# Patient Record
Sex: Male | Born: 1935 | Race: White | Hispanic: No | Marital: Married
Health system: Southern US, Community
[De-identification: ages and names within clinical notes are randomized; demographics above are authoritative.]

---

## 2004-07-12 ENCOUNTER — Ambulatory Visit: Payer: Self-pay | Admitting: General Surgery

## 2004-07-12 ENCOUNTER — Other Ambulatory Visit: Payer: Self-pay

## 2004-07-18 ENCOUNTER — Ambulatory Visit: Payer: Self-pay | Admitting: General Surgery

## 2004-09-05 ENCOUNTER — Ambulatory Visit: Payer: Self-pay | Admitting: General Surgery

## 2005-02-11 ENCOUNTER — Other Ambulatory Visit: Payer: Self-pay

## 2005-02-11 ENCOUNTER — Observation Stay: Payer: Self-pay | Admitting: Internal Medicine

## 2008-11-12 ENCOUNTER — Ambulatory Visit: Payer: Self-pay | Admitting: Family Medicine

## 2009-06-15 ENCOUNTER — Inpatient Hospital Stay: Payer: Self-pay | Admitting: Internal Medicine

## 2009-06-17 ENCOUNTER — Emergency Department: Payer: Self-pay | Admitting: Internal Medicine

## 2009-06-25 ENCOUNTER — Ambulatory Visit: Payer: Self-pay | Admitting: Vascular Surgery

## 2009-07-07 ENCOUNTER — Inpatient Hospital Stay: Payer: Self-pay | Admitting: Vascular Surgery

## 2009-10-25 ENCOUNTER — Encounter: Payer: Self-pay | Admitting: Family Medicine

## 2009-11-05 ENCOUNTER — Encounter: Payer: Self-pay | Admitting: Family Medicine

## 2011-05-22 ENCOUNTER — Ambulatory Visit: Payer: Self-pay | Admitting: Family Medicine

## 2011-07-02 ENCOUNTER — Inpatient Hospital Stay: Payer: Self-pay | Admitting: Internal Medicine

## 2011-07-02 LAB — COMPREHENSIVE METABOLIC PANEL
Albumin: 3 g/dL — ABNORMAL LOW (ref 3.4–5.0)
Alkaline Phosphatase: 53 U/L (ref 50–136)
Bilirubin,Total: 0.6 mg/dL (ref 0.2–1.0)
Co2: 23 mmol/L (ref 21–32)
Creatinine: 1.96 mg/dL — ABNORMAL HIGH (ref 0.60–1.30)
EGFR (Non-African Amer.): 35 — ABNORMAL LOW
Glucose: 189 mg/dL — ABNORMAL HIGH (ref 65–99)
Osmolality: 306 (ref 275–301)
SGOT(AST): 20 U/L (ref 15–37)
SGPT (ALT): 31 U/L
Sodium: 145 mmol/L (ref 136–145)

## 2011-07-02 LAB — DRUG SCREEN, URINE
Amphetamines, Ur Screen: NEGATIVE (ref ?–1000)
Benzodiazepine, Ur Scrn: NEGATIVE (ref ?–200)
Cocaine Metabolite,Ur ~~LOC~~: NEGATIVE (ref ?–300)
MDMA (Ecstasy)Ur Screen: NEGATIVE (ref ?–500)
Methadone, Ur Screen: NEGATIVE (ref ?–300)
Opiate, Ur Screen: NEGATIVE (ref ?–300)
Tricyclic, Ur Screen: NEGATIVE (ref ?–1000)

## 2011-07-02 LAB — ETHANOL
Ethanol %: <0.003 % (ref 0.000–0.080)
Ethanol: <3 mg/dL

## 2011-07-02 LAB — URINALYSIS, COMPLETE
Bacteria: NONE SEEN
Blood: NEGATIVE
Hyaline Cast: 3
Ketone: NEGATIVE
Leukocyte Esterase: NEGATIVE
Nitrite: NEGATIVE
Protein: NEGATIVE
Specific Gravity: 1.02 (ref 1.003–1.030)

## 2011-07-02 LAB — CK TOTAL AND CKMB (NOT AT ARMC)
CK, Total: 53 U/L (ref 35–232)
CK-MB: 2.3 ng/mL (ref 0.5–3.6)

## 2011-07-02 LAB — TROPONIN I: Troponin-I: 0.02 ng/mL

## 2011-07-02 LAB — CBC
HCT: 43.8 % (ref 40.0–52.0)
HGB: 14.9 g/dL (ref 13.0–18.0)
MCH: 30.7 pg (ref 26.0–34.0)
MCHC: 34.1 g/dL (ref 32.0–36.0)
MCV: 90 fL (ref 80–100)
RBC: 4.86 10*6/uL (ref 4.40–5.90)

## 2011-07-02 LAB — PROTIME-INR: Prothrombin Time: 13.6 secs (ref 11.5–14.7)

## 2011-07-03 LAB — CBC WITH DIFFERENTIAL/PLATELET
Basophil #: 0 10*3/uL (ref 0.0–0.1)
Basophil %: 0.1 %
Eosinophil #: 0 10*3/uL (ref 0.0–0.7)
Eosinophil %: 0.4 %
HGB: 14.3 g/dL (ref 13.0–18.0)
Lymphocyte #: 2.3 10*3/uL (ref 1.0–3.6)
MCH: 30 pg (ref 26.0–34.0)
MCV: 91 fL (ref 80–100)
Monocyte #: 0.8 10*3/uL — ABNORMAL HIGH (ref 0.0–0.7)
Monocyte %: 5.9 %
Neutrophil #: 9.6 10*3/uL — ABNORMAL HIGH (ref 1.4–6.5)
Neutrophil %: 75.7 %
Platelet: 119 10*3/uL — ABNORMAL LOW (ref 150–440)
RBC: 4.78 10*6/uL (ref 4.40–5.90)
WBC: 12.7 10*3/uL — ABNORMAL HIGH (ref 3.8–10.6)

## 2011-07-03 LAB — LIPID PANEL
HDL Cholesterol: 27 mg/dL — ABNORMAL LOW (ref 40–60)
Ldl Cholesterol, Calc: 65 mg/dL (ref 0–100)
Triglycerides: 252 mg/dL — ABNORMAL HIGH (ref 0–200)
VLDL Cholesterol, Calc: 50 mg/dL — ABNORMAL HIGH (ref 5–40)

## 2011-07-03 LAB — HEMOGLOBIN A1C: Hemoglobin A1C: 7.2 % — ABNORMAL HIGH (ref 4.2–6.3)

## 2011-07-03 LAB — BASIC METABOLIC PANEL
BUN: 42 mg/dL — ABNORMAL HIGH (ref 7–18)
Calcium, Total: 8.3 mg/dL — ABNORMAL LOW (ref 8.5–10.1)
Co2: 25 mmol/L (ref 21–32)
Creatinine: 1.89 mg/dL — ABNORMAL HIGH (ref 0.60–1.30)
EGFR (African American): 45 — ABNORMAL LOW
EGFR (Non-African Amer.): 37 — ABNORMAL LOW
Glucose: 93 mg/dL (ref 65–99)
Potassium: 4.4 mmol/L (ref 3.5–5.1)
Sodium: 144 mmol/L (ref 136–145)

## 2011-07-04 LAB — CBC WITH DIFFERENTIAL/PLATELET
Basophil %: 0.2 %
Eosinophil #: 0.1 10*3/uL (ref 0.0–0.7)
Eosinophil %: 0.6 %
HGB: 14.3 g/dL (ref 13.0–18.0)
Lymphocyte %: 17.5 %
MCH: 30.2 pg (ref 26.0–34.0)
Monocyte #: 0.8 10*3/uL — ABNORMAL HIGH (ref 0.0–0.7)
Neutrophil #: 8.7 10*3/uL — ABNORMAL HIGH (ref 1.4–6.5)
Neutrophil %: 74.5 %
Platelet: 107 10*3/uL — ABNORMAL LOW (ref 150–440)
RBC: 4.74 10*6/uL (ref 4.40–5.90)

## 2011-07-04 LAB — BASIC METABOLIC PANEL
BUN: 45 mg/dL — ABNORMAL HIGH (ref 7–18)
Calcium, Total: 8.3 mg/dL — ABNORMAL LOW (ref 8.5–10.1)
Creatinine: 2.13 mg/dL — ABNORMAL HIGH (ref 0.60–1.30)
EGFR (Non-African Amer.): 32 — ABNORMAL LOW
Glucose: 100 mg/dL — ABNORMAL HIGH (ref 65–99)
Osmolality: 295 (ref 275–301)
Potassium: 4.3 mmol/L (ref 3.5–5.1)

## 2011-07-08 LAB — CULTURE, BLOOD (SINGLE)

## 2011-07-11 ENCOUNTER — Encounter: Payer: Self-pay | Admitting: Family Medicine

## 2011-08-07 ENCOUNTER — Encounter: Payer: Self-pay | Admitting: Family Medicine

## 2011-09-06 ENCOUNTER — Encounter: Payer: Self-pay | Admitting: Family Medicine

## 2011-10-07 ENCOUNTER — Encounter: Payer: Self-pay | Admitting: Family Medicine

## 2011-11-06 ENCOUNTER — Encounter: Payer: Self-pay | Admitting: Family Medicine

## 2011-12-07 ENCOUNTER — Encounter: Payer: Self-pay | Admitting: Family Medicine

## 2012-01-07 ENCOUNTER — Encounter: Payer: Self-pay | Admitting: Family Medicine

## 2012-03-23 ENCOUNTER — Ambulatory Visit: Payer: Self-pay | Admitting: Internal Medicine

## 2012-04-19 IMAGING — CT CT HEAD WITHOUT CONTRAST
2 series · 16 of 30 positions shown, 20 images · non-contrast
Comparison: none

REASON FOR EXAM: ALOC
COMMENTS:   May transport without cardiac monitor

PROCEDURE:     CT  - CT HEAD WITHOUT CONTRAST  - July 02, 2011 [DATE]
RESULT:     Comparison:  06/17/2009
TECHNIQUE: Multiple axial images from the foramen magnum to the vertex were
obtained without IV contrast.

[Series 2: without · axial · non-contrast · 0.41mm/px · z∈[+338,+462]mm · 13 of 31 slices shown, 17 images]
[im 3/31  brain]
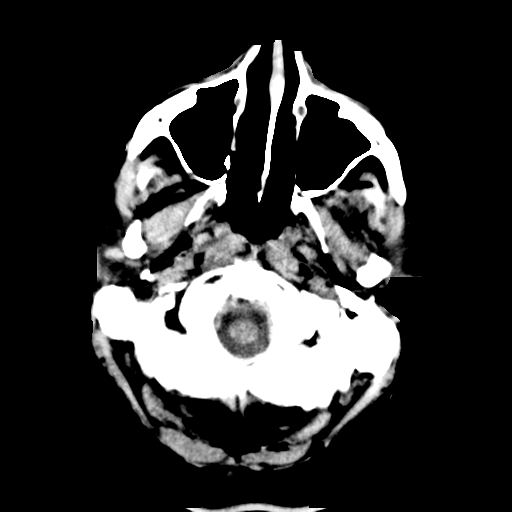
[im 3/31  bone]
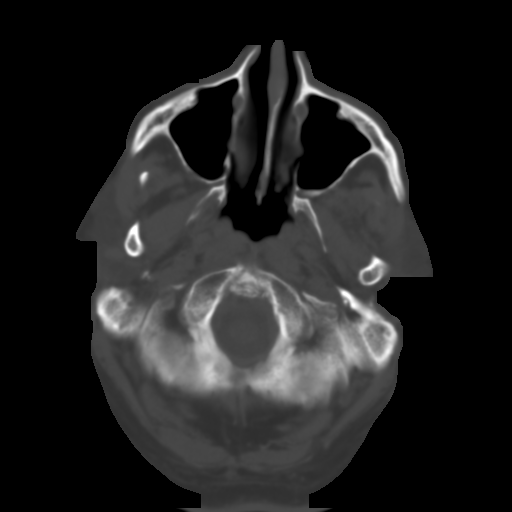
[im 5/31  brain]
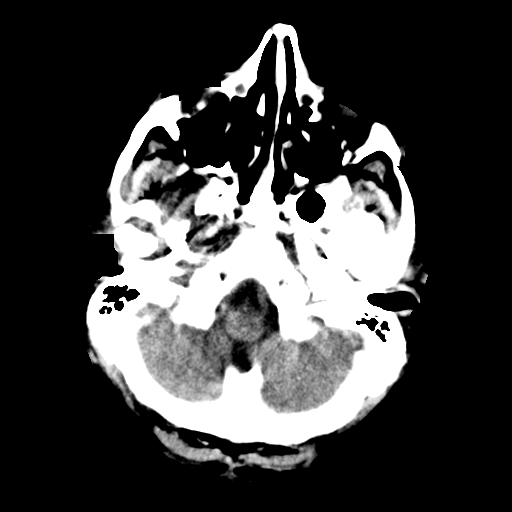
[im 7/31  brain]
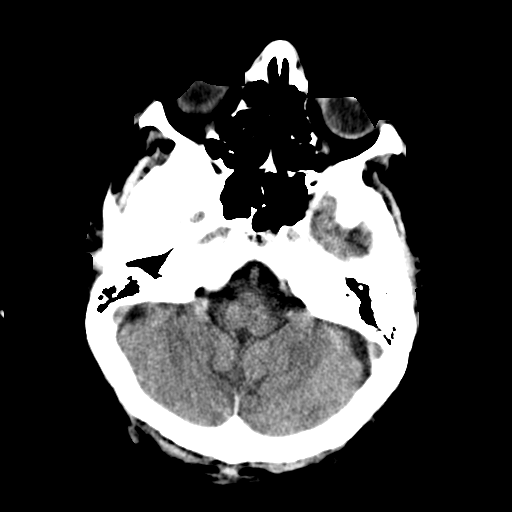
[im 9/31  brain]
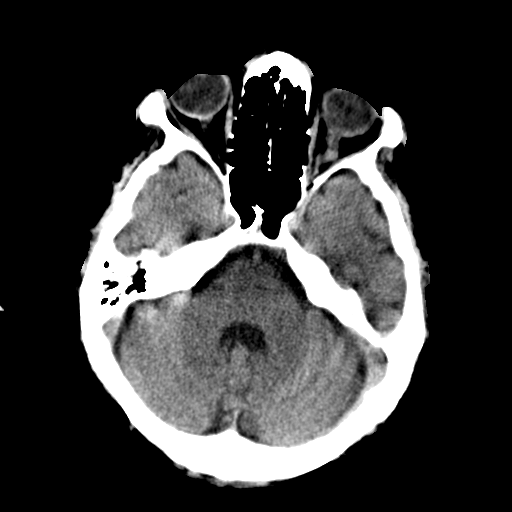
[im 11/31  brain]
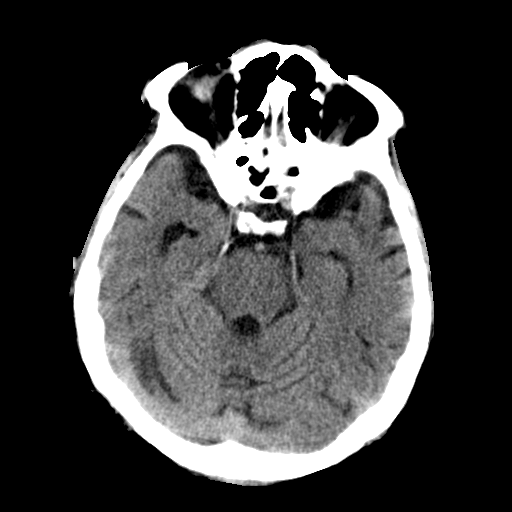
[im 11/31  bone]
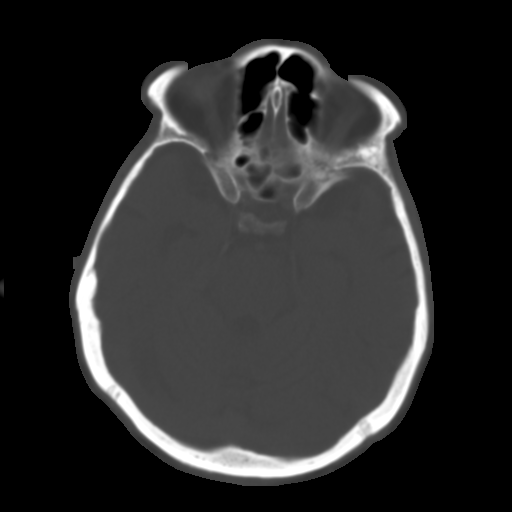
[im 13/31  brain]
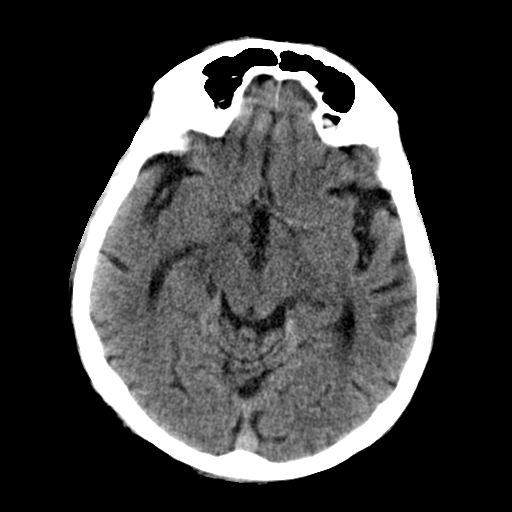
[im 16/31  brain]
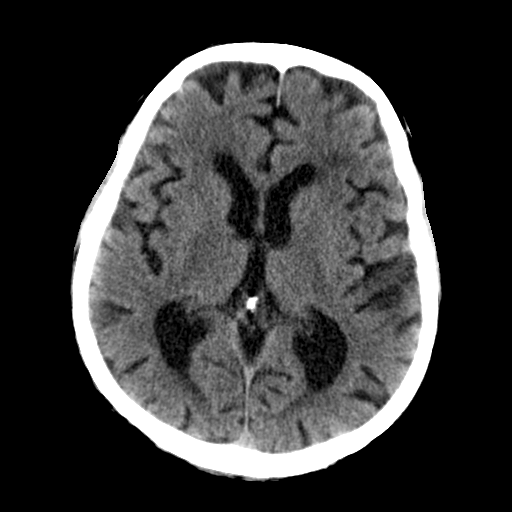
[im 18/31  brain]
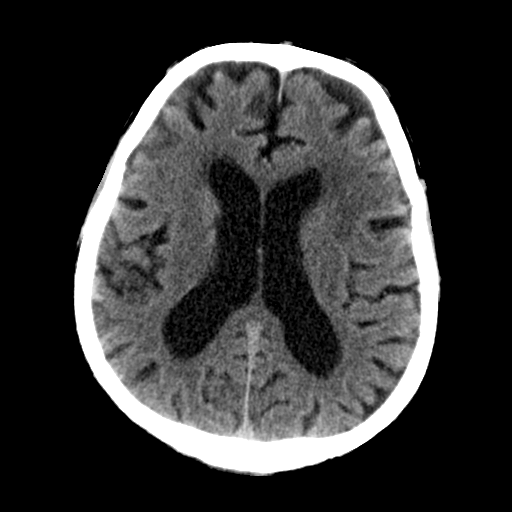
[im 20/31  brain]
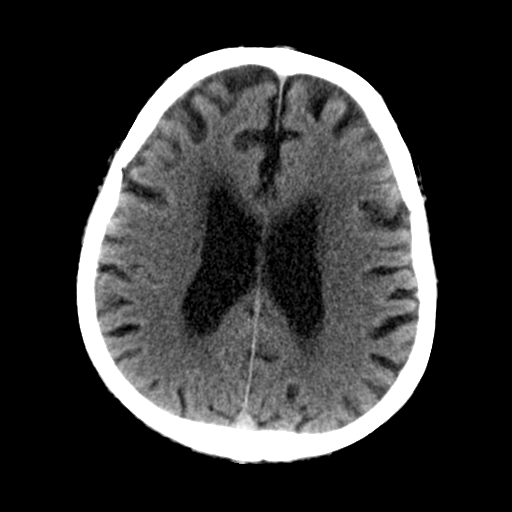
[im 20/31  bone]
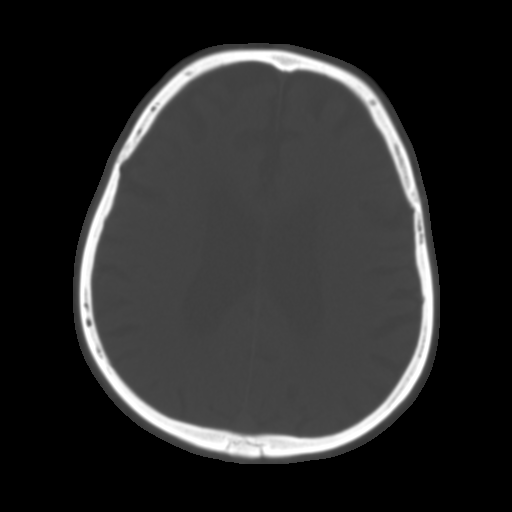
[im 22/31  brain]
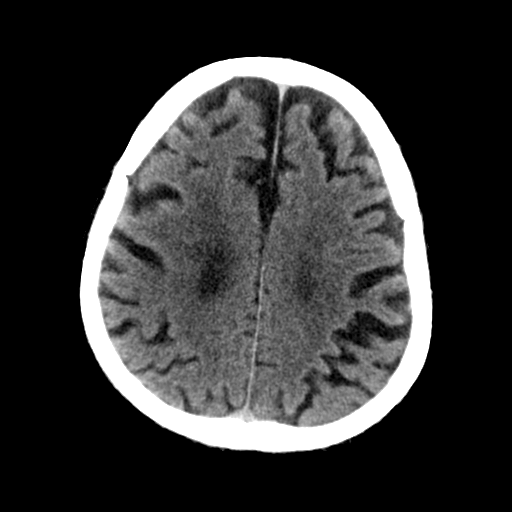
[im 24/31  brain]
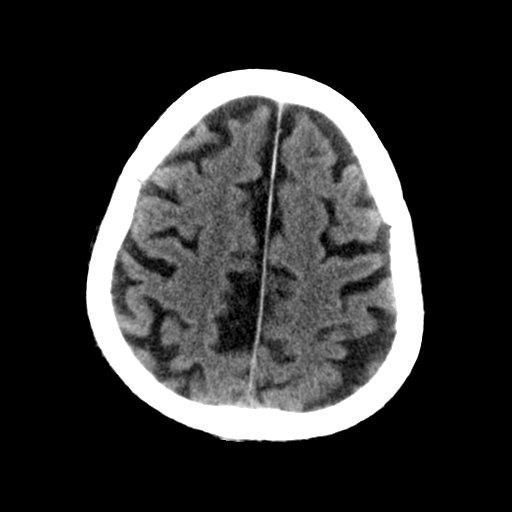
[im 26/31  brain]
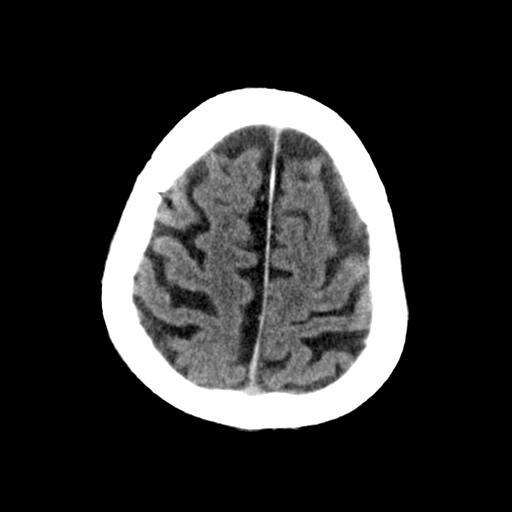
[im 28/31  brain]
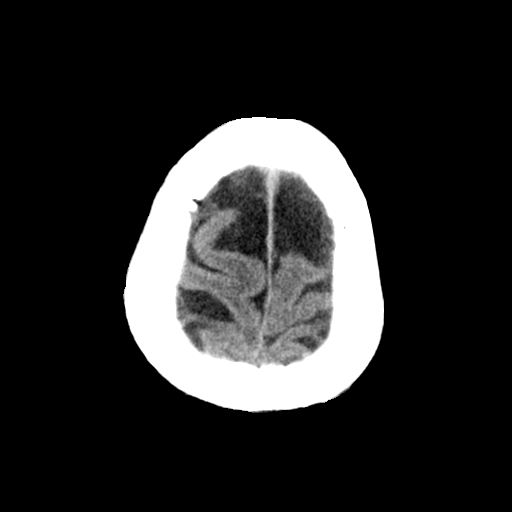
[im 28/31  bone]
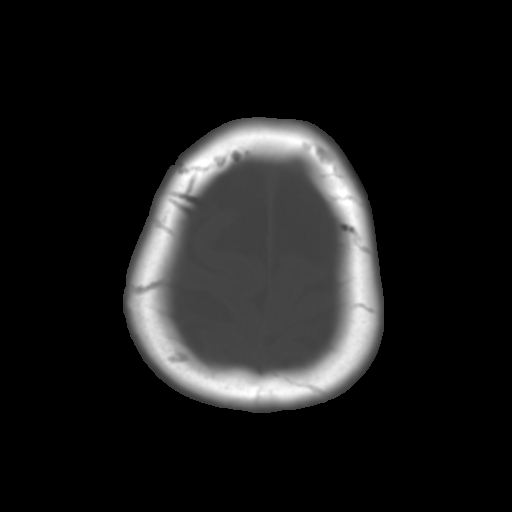

[Series 3: bone · axial · 0.41mm/px · z∈[+338,+378]mm · 3 of 31 slices shown]
[im 3/31  bone]
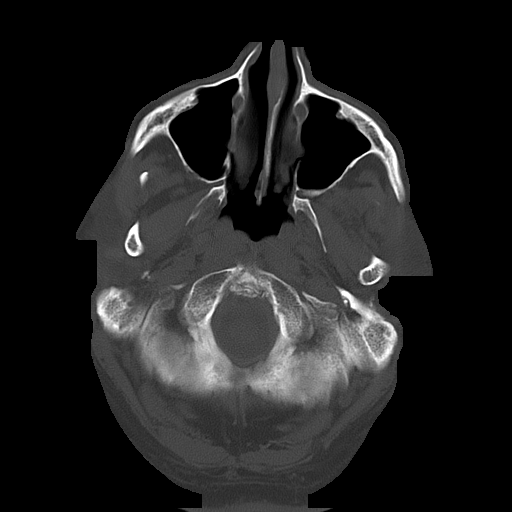
[im 7/31  bone]
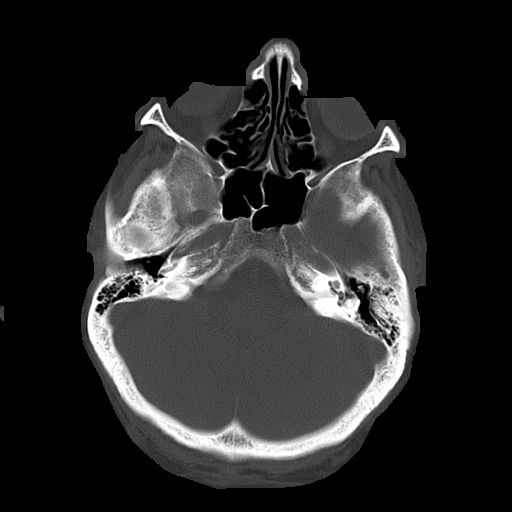
[im 11/31  bone]
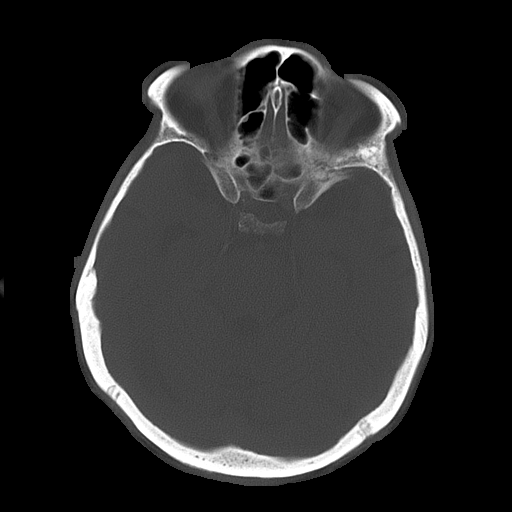

[16 of 30 positions shown; findings below may reference images not displayed]

FINDINGS: There is no evidence for mass effect, midline shift, or extra-axial fluid
collections. There is no evidence for space-occupying lesion, intracranial
hemorrhage, or cortical-based area of infarction. Periventricular and
subcortical hypoattenuation is consistent with chronic small vessel ischemic
disease. There is an old lacunar infarct in the right basal ganglia, similar
to prior.

The osseous structures are unremarkable.
IMPRESSION: 1. No acute intracranial process.
2. Chronic small vessel ischemic disease.

## 2012-04-20 IMAGING — CR DG CHEST 2V
1 series · 2 of 2 positions shown · non-contrast
Comparison: none

REASON FOR EXAM: pneumonia
COMMENTS:

[Series 2: w chest ap · 0.14mm/px · 2 of 2 slices shown]
[im 1/2]
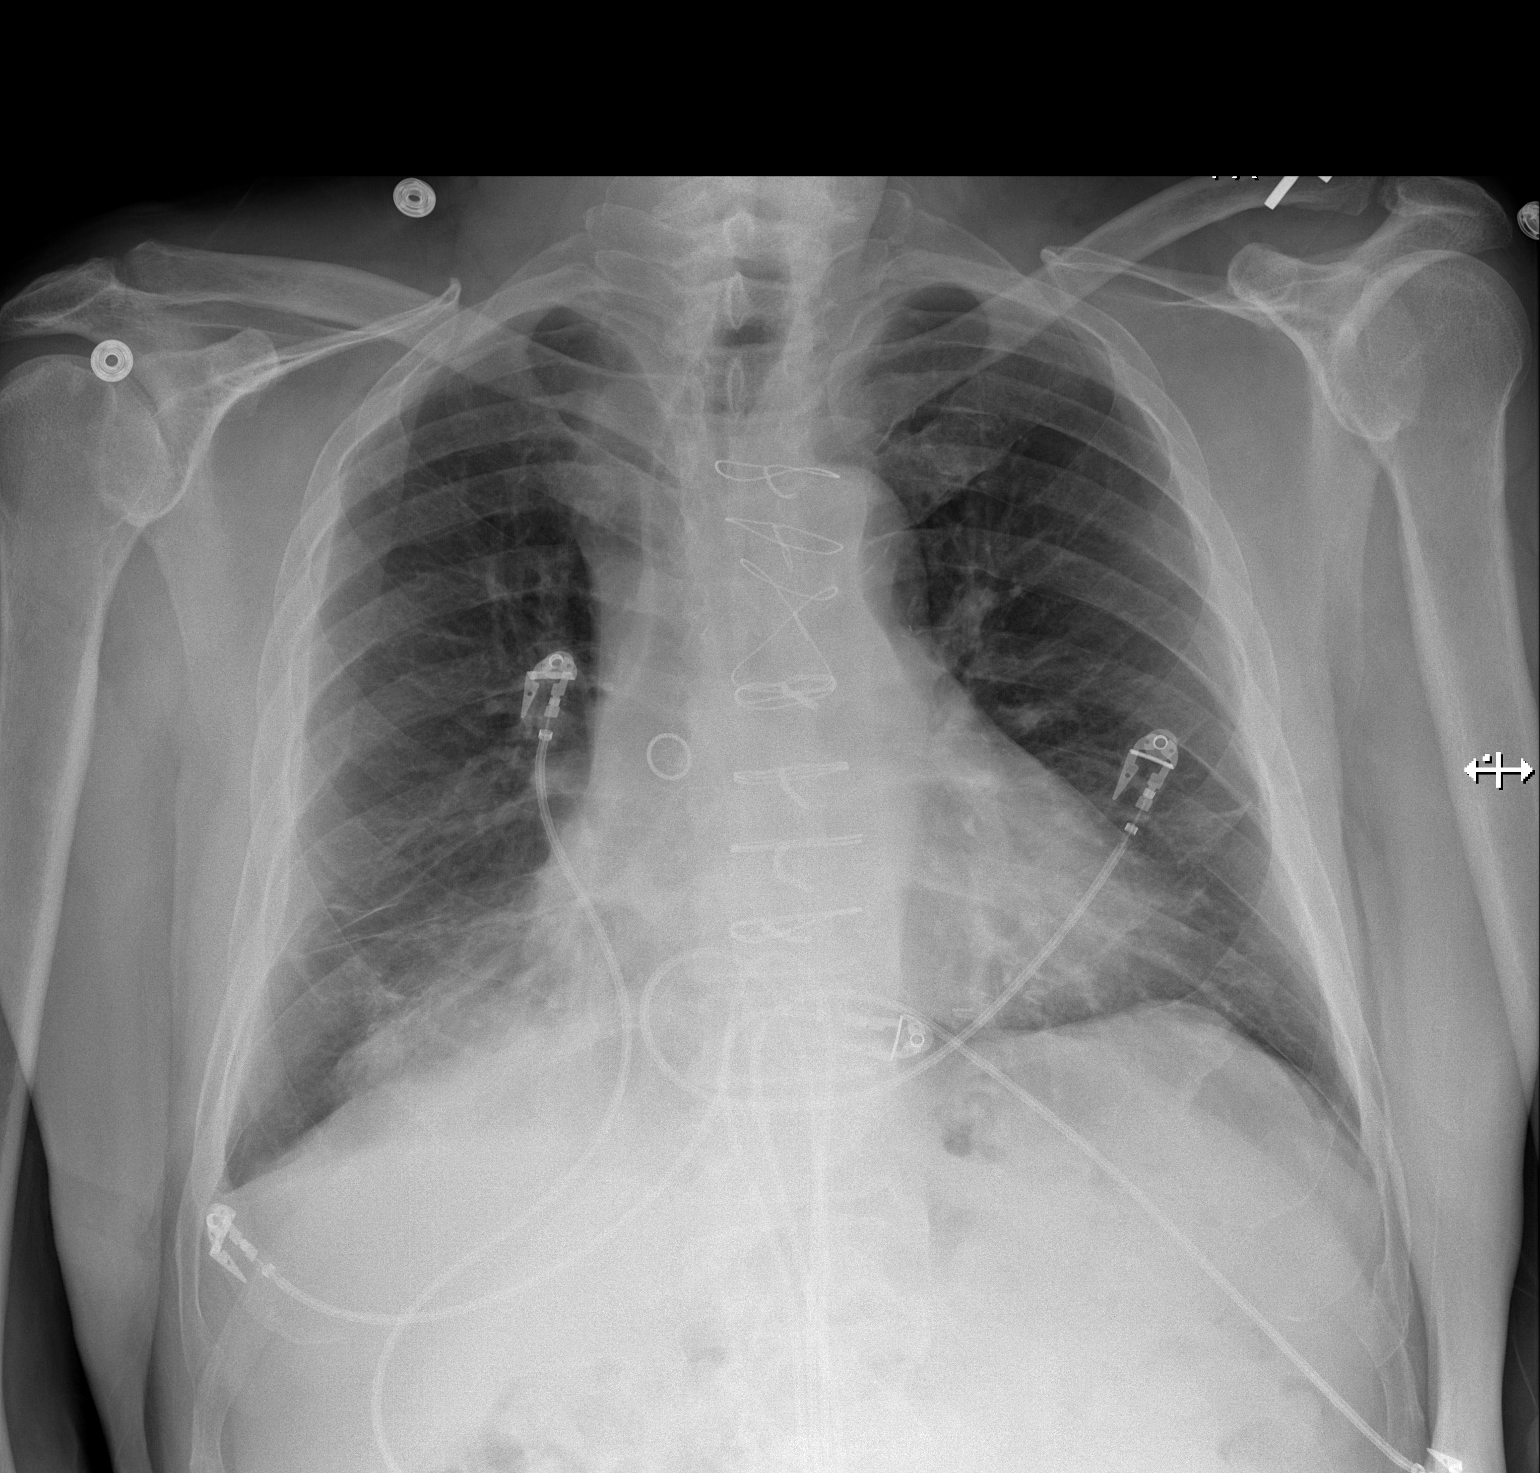
[im 2/2]
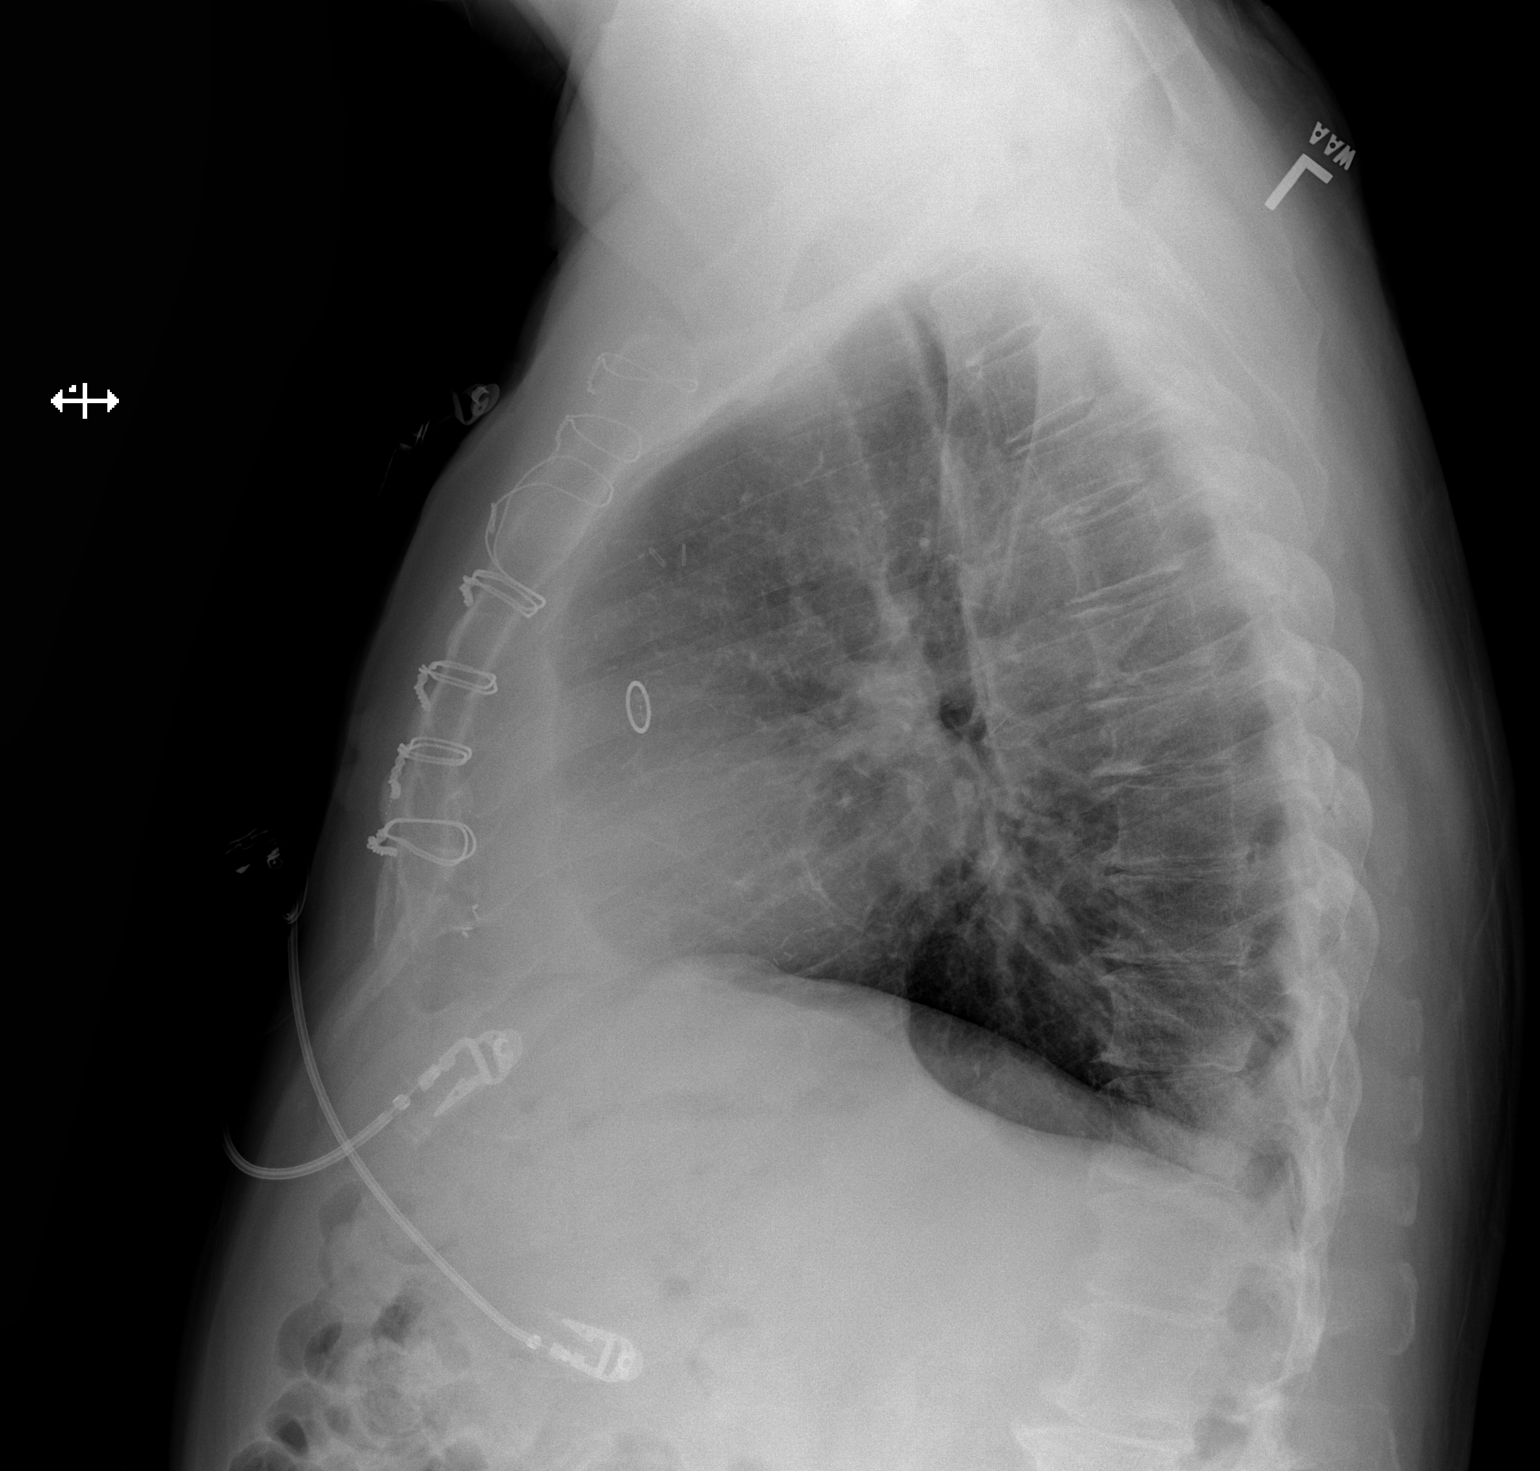

[2 of 2 positions shown; findings below may reference images not displayed]

PROCEDURE:     DXR - DXR CHEST PA (OR AP) AND LATERAL  - July 03, 2011  [DATE]

RESULT:     Comparison is made to the prior exam of 07/02/2011. Fibrotic
changes are noted at the right base. There is slight discoid atelectasis at
the left base. No acute changes of the heart or pulmonary vasculature are
observed. Postoperative changes of prior CABG are again noted.
IMPRESSION: 1. Fibrotic changes are noted at the right base.
2. There is minimal atelectasis at the left base.
3. No acute pulmonary infiltrative changes are seen.

## 2012-06-26 ENCOUNTER — Ambulatory Visit: Payer: Self-pay | Admitting: Gastroenterology

## 2012-10-06 DEATH — deceased

## 2014-08-30 NOTE — Discharge Summary (Signed)
PATIENT NAME:  [name], [name] MR#:  [id number] DATE OF BIRTH:  05-27-1931  DATE OF ADMISSION:  07/02/2011 DATE OF DISCHARGE:  07/05/2011  PRIMARY CARE PHYSICIAN: Mila Merry, MD  PRIMARY VASCULAR SURGEON: Festus Barren, MD   REASON FOR ADMISSION: Altered mental status.   DISCHARGE DIAGNOSES:  1. Acute left frontal lobe ischemic cerebrovascular accident with residual intermittent confusion, right hand numbness and tingling, and expressive aphasia. 2. Left carotid artery stenosis 50% by Doppler ultrasound.  3. History of left carotid artery stenosis status post carotid endarterectomy.  4. Bradycardia felt to be medication induced.  5. History of hypertension.  6. History of type II diabetes mellitus, diet controlled.  7. Stage III CKD. 8. Hyperlipidemia.  9. Peripheral vascular disease.  10. History of coronary artery disease status post coronary artery bypass graft.  11. History of cerebrovascular accident in the past. 12. Leukocytosis felt to be steroid induced.  13. Thrombocytopenia.   CONSULT: Neurology with Dr. Kemper Durie    DISCHARGE DISPOSITION: Home with outpatient speech therapy.   DISCHARGE MEDICATIONS:  1. Aspirin 81 mg daily. 2. Plavix 75 mg daily.  3. Simvastatin 40 mg daily. 4. Losartan 50 mg daily.  5. Diphenhydramine 50 mg at bedtime.  6. VESIcare 10 mg daily. 7. Norvasc 10 mg daily. 8. Fish Oil 2 grams daily.   DISCHARGE ACTIVITY: As tolerated.   DISCHARGE DIET: Low sodium, low fat, low cholesterol, ADA.   DISCHARGE CONDITION: Improved, stable.    DISCHARGE INSTRUCTIONS:  1. Take medication as prescribed.  2. Return to the Emergency Department for recurrence of symptoms. 3. Do not take Coreg until further advised by your primary care physician or cardiologist.   FOLLOW-UP INSTRUCTIONS:  1. Follow-up with Dr. Sherrie Mustache within 1 to 2 weeks. The patient needs repeat CBC and BMP within one week and repeat blood pressure check within one week. 2. Follow-up with  Dr. Wyn Quaker within 1 to 2 weeks for PVD involving the lower extremities and also carotid artery stenosis.  3. Follow-up with your cardiologist as previously advised.   REFERRAL: The patient is being referred to first available physician/nephrologist at [facility] in 1 to 2 weeks for CKD.   PROCEDURES:  1. Noncontrast head CT 07/02/2011 no acute intracranial process. There is chronic small vessel ischemic disease.  2. Bilateral carotid artery Doppler ultrasound 07/02/2011 by visual inspection there is approximately 50% stenosis of the proximal left ICA, however, the peak systolic velocities are not elevated. No evidence of any hemodynamically significant stenosis in the right extracranial carotid artery. Vertebral arteries are patent and demonstrate antegrade flow bilaterally.  3. Chest x-ray, PA and lateral, 07/02/2011 fibrotic changes noted in the right lung base, minimal atelectasis left lung base. No acute pulmonary infiltrate or changes are seen.  4. Brain MRI without contrast 07/03/2011 acute ischemia in the left frontal lobe.  5. 2-D echocardiogram 07/03/2011 LV grossly normal size. No thrombus. LV systolic function normal. EF greater than 55%. Mild concentric LVH. LV wall motion normal. RV systolic function normal. Mild MR. Mild to moderate TR. Mild to moderate AR.   PERTINENT LABORATORY DATA: Blood cultures x2 from 07/02/2011 no growth to date. INR 1 on admission. Hemoglobin and hematocrit normal on admission. Cardiac enzymes negative on admission. BUN 47, creatinine 1.96 on admission with BUN 45, creatinine 2.13 at the time of discharge. Hemoglobin A1c was 7.2. Lipid panel total cholesterol 142, triglycerides 252, HDL 27, LDL 65. WBC count was 13.4 on admission and 11.6 on 07/04/2011.  BRIEF HISTORY AND HOSPITAL COURSE: The patient is a 81 year old pleasant male with past medical history of hypertension, hyperlipidemia, type II diabetes mellitus, coronary artery disease  status post coronary artery bypass graft, cerebrovascular accident, peripheral vascular disease, carotid artery stenosis status post left carotid artery endarterectomy who was brought to the ER for altered mental status and confusion. Please see dictated admission history and physical for pertinent details surrounding the onset of this hospitalization. Please see below for further details. 1. Acute left frontal lobe ischemic cerebrovascular accident confirmed on MRI. Initially the patient's symptoms were concerning for cerebrovascular event. He underwent a noncontrast head CT which was negative for acute intracranial abnormalities. Thereafter he was admitted to the telemetry unit for further work-up and management. He was already on aspirin and Plavix therapy at the time of admission and was maintained on these agents. Stroke work-up was performed. Acute ischemic stroke was confirmed on MRI. Otherwise, 2-D echocardiogram was benign. On tele monitoring he had a few episodes of sinus bradycardia but no tachyarrhythmias or atrial fibrillation. Carotid Doppler's were performed revealing 50% ICA stenosis. The patient has history of left carotid endarterectomy. He will continue medical management for his carotid artery stenosis for now with aspirin, Plavix, and statin therapy and will follow-up with Vascular Surgery as an outpatient for further recommendations and for close follow-up. In regards to the patient's CVA, he has some residual intermittent confusion, expressive aphasia, and residual right arm numbness and tingling. He was seen by PT and did well and PT did not recommend any home health or outpatient PT. He was seen by Neurology and neurologist, Dr. Kemper Durie, was in agreement with overall medical management plan with aspirin, Plavix, statin therapy, and aggressive risk factor modification and recommended outpatient speech therapy for the patient's expressive aphasia which has been arranged for this patient. Fish  Oil was also added to his regimen. Lipid panel with results as above.  2. Sinus bradycardia felt to be medication induced from Coreg. This agent has been discontinued. The patient's heart rate is appropriately normalized after discontinuing Coreg. His bradycardia was asymptomatic of note. He does have underlying coronary artery disease and will follow-up with his cardiologist as an outpatient and continue medical management for now. He was without any chest pain or heart palpitations during this hospitalization.  3. Carotid artery stenosis status post left carotid endarterectomy still with 50% stenosis involving left ICA by carotid Doppler for which the patient will follow-up with Vascular Surgery upon discharge. In the meanwhile, will continue aspirin, Plavix, and statin therapy. 4. Hypertension. Blood pressure medications have been adjusted. He did have periods where his blood pressure was borderline low and blood pressure medications were placed on hold initially and we allowed for some permissive hypertension in the setting of an acute stroke and we hydrated him with IV fluids for blood pressure support. We also discontinued his Coreg especially given underlying bradycardia. Thereafter the patient's blood pressure started to appropriately rise. His blood pressure medications were slowly reinstituted into his regimen and he will continue Norvasc and losartan as an outpatient and continue to hold Coreg for now. Recommend repeat blood pressure check per the patient's primary care physician within one week.  5. Type II diabetes mellitus. Hemoglobin A1c 7.2. The patient wishes to resume diet control and exercise for now before being started on any medications and he will need to have this closely followed as an outpatient. He was advised to adhere to an ADA diet.  6. Leukocytosis felt to be  steroid-induced as the patient was taking prednisone at the time of admission for some swelling and numbness of the right  hand. Blood cultures were negative. The patient has remained afebrile. His WBC count has improved. Recommend repeat CBC per the patient's primary care physician within one week. leukocytosis felt to be steroid induced. 7. Thrombocytopenia. There was no obvious etiology. This could be chronic. The patient will need cautious use of aspirin and Plavix and there were no absolute indications for platelet transfusion during this hospitalization as he has mild thrombocytopenia and this can further be worked up as an outpatient. The patient was in agreement with this plan. Recommend repeat CBC per the patient's primary care physician within one week to reassess his white blood cell count as well as his platelet count. 8. Stage III CKD, could be from underlying hypertension and diabetes, also possible renal artery stenosis given significant peripheral vascular disease. Renal artery Doppler ultrasound is not performed at our hospital. The patient has remained nonoliguric. He will be referred to Nephrology as an outpatient for CKD management and will also follow-up with Vascular Surgery as an outpatient in the event of possible renal artery stenosis.  9. Hyperlipidemia. LDL is at goal but VLDL and triglycerides were elevated with low HDL. He was advised to exercise often. He will also continue statin. Neurology recommended adding Fish Oil to his regimen.  10. Overall the patient's mental status has improved. He has had periods of intermittent confusion but was mentating well on the day of discharge. He still has a small amount of expressive aphasia for which he will follow-up with outpatient speech therapy and he still has a small amount of right arm numbness and tingling but this has significantly improved. He will need close outpatient follow-up.   On 07/05/2011, the patient was hemodynamically stable and felt to be stable for discharge home with close outpatient follow-up to which the patient and his daughters were  agreeable. He was also felt to be stable for discharge home per Neurology.   TIME SPENT ON DISCHARGE: Greater than 30 minutes.   ____________________________ Elon AlasKamran N. Elif Yonts, MD knl:drc D: 07/08/2011 16:33:43 ET T: 07/09/2011 13:46:56 ET JOB#: [id number]  cc: Elon AlasKamran N. Vonte Rossin, MD, <Dictator> Demetrios Isaacsonald E. Sherrie MustacheFisher, MD Annice NeedyJason S. Dew, MD Munsoor Lizabeth LeydenN. Isadore Bokhari, MD Elon AlasKAMRAN N Jorgen Wolfinger MD ELECTRONICALLY SIGNED 07/12/2011 21:06

## 2014-08-30 NOTE — H&P (Signed)
PATIENT NAME:  [name] [name], CORP MR#:  [id number] DATE OF BIRTH:  February 01, 1932  DATE OF ADMISSION:  07/02/2011  REFERRING PHYSICIAN: Gaetano Net, MD  FAMILY PHYSICIAN: Mila Merry, MD  REASON FOR ADMISSION: Altered mental status.   HISTORY OF PRESENT ILLNESS: The patient is a 81 year old male who lives alone and is followed by Dr. Sherrie Mustache with a history of previous stroke, peripheral vascular disease status post left carotid endarterectomy, coronary artery disease status post CABG, and hyperlipidemia. He presents to the emergency room today for confusion which began last evening. The confusion has been noticed by friends and family. The patient denies complaints. He states that there is no issue. Specifically, he denies any fevers or headaches. No visual changes. No focal numbness or weakness. Denies cough or shortness of breath. In the emergency room, the patient was noted to be dehydrated with a mild leukocytosis. Chest x-ray suggested pneumonia. He is now admitted for further evaluation.   PAST MEDICAL HISTORY:  1. Arteriosclerotic cardiovascular disease status post coronary artery bypass graft. 2. Previous stroke.  3. Peripheral vascular disease status post left carotid endarterectomy.  4. Hyperlipidemia.  5. Benign hypertension.  6. Type 2 diabetes mellitus.   MEDICATIONS:  1. Zocor 40 mg p.o. daily.  2. Plavix 75 mg p.o. daily.  3. Cozaar 100 mg p.o. daily.  4. Detrol 2 mg p.o. twice a day. 5. Coreg 6.25 mg p.o. twice a day.  ALLERGIES: No known drug allergies.   SOCIAL HISTORY: The patient is divorced. No history of alcohol or tobacco abuse.   FAMILY HISTORY: Positive for diabetes, hypertension, stroke, and coronary artery disease.   REVIEW OF SYSTEMS: CONSTITUTIONAL: The patient denies fever or change in weight. EYES: No blurred or double vision. No glaucoma. ENT: No tinnitus or hearing loss. No nasal discharge or bleeding. No difficulty swallowing. RESPIRATORY: No cough or  wheezing. Denies hemoptysis. CARDIOVASCULAR: No chest pain or orthopnea. No palpitations or syncope. GI: No nausea, vomiting, or diarrhea. No abdominal pain. No change in bowel habits. GU: No dysuria or hematuria. No incontinence. ENDOCRINE: No polyuria or polydipsia. No heat or cold intolerance. HEMATOLOGIC: The patient denies anemia, easy bruising, or bleeding. LYMPHATIC: No swollen glands. MUSCULOSKELETAL: The patient denies pain in his neck, back, shoulders, knees, or hips. No gout. NEUROLOGIC: No numbness or weakness. Denies migraines. No seizures. PSYCH: The patient denies anxiety, insomnia, or depression.   PHYSICAL EXAMINATION:   GENERAL: The patient is elderly, pleasant and minimally confused, but in no acute distress.   VITAL SIGNS: Vital signs are remarkable for a blood pressure of 137/71 with a heart rate of 74 and a respiratory rate of 20. He is afebrile.   HEENT: Normocephalic, atraumatic. Pupils are equally round and reactive to light and accommodation. Extraocular movements are intact. Sclerae anicteric. Conjunctivae are clear. Oropharynx is clear.    NECK: Supple without jugular venous distention or bruits. No adenopathy or thyromegaly is noted.   LUNGS: Clear to auscultation and percussion without wheezes or rales. Faint basilar rhonchi are noted. No dullness.   CARDIAC: Regular rate and rhythm with normal S1 and S2. No significant rubs, murmurs, or gallops. PMI is nondisplaced. Chest wall is nontender.   ABDOMEN: Soft and nontender with normoactive bowel sounds. No organomegaly or masses were appreciated. No hernias or bruits were noted.   EXTREMITIES: No clubbing, cyanosis, or edema. Pulses were 2+ bilaterally.   SKIN: Warm and dry without rash or lesions.   NEUROLOGIC: Cranial nerves II through XII grossly intact.  Deep tendon reflexes are symmetric. Motor and sensory exam was nonfocal.   PSYCHIATRIC: The patient was alert and oriented to person but not to time or place.    LABS/STUDIES: Urine drug screen was negative.   Urinalysis was negative.   White count was 13.4 with a hemoglobin of 14.9. Glucose was 189 with a BUN of 47 and a creatinine of 1.96 with a GFR of 35.   Head CT showed chronic small vessel changes.   Chest x-ray revealed right basilar opacity with possible pneumonia.   ASSESSMENT:  1. Altered mental status.  2. Presumed right lower lobe pneumonia.  3. Dehydration.  4. Stage III chronic kidney disease.  5. Type 2 diabetes mellitus.  6. Peripheral vascular disease status post left carotid endarterectomy.  7. Benign hypertension.  8. Atherosclerotic cardiovascular disease status post coronary artery bypass graft.   PLAN: The patient will be admitted to telemetry with Plavix and Lovenox. We will perform neuro checks every 4 hours. We will check an echocardiogram, carotid Doppler's, and a MRI of the brain. We will consult PT for ambulation. We will follow his sugars with Accu-Cheks before meals and at bedtime and add sliding scale insulin as needed. We will begin IV fluids for hydration and follow his renal function closely. We will begin oxygen, SVNs, and IV antibiotics for his pneumonia. Follow-up chest x-ray in the morning. We will continue his other outpatient regimen for now. Care manager to consult for discharge planning. Blood cultures have been sent    off. We will also send off sputum culture. Routine labs in the morning. Further treatment and evaluation will depend upon the patient's progress.   TOTAL TIME SPENT ON ADMISSION: 50 minutes.  ____________________________ Duane LopeJeffrey D. Judithann SheenSparks, MD jds:slb D: 07/02/2011 15:32:15 ET T: 07/02/2011 15:42:47 ET JOB#: [id number]  cc: Duane LopeJeffrey D. Judithann SheenSparks, MD, <Dictator> Demetrios Isaacsonald E. Sherrie MustacheFisher, MD Marguarite ArbourJEFFREY D Amedio Bowlby MD ELECTRONICALLY SIGNED 07/02/2011 16:47

## 2014-08-30 NOTE — Consult Note (Signed)
PATIENT NAME:  [name] [name], [name] [name] MR#:  [id number] DATE OF BIRTH:  [birth year]  DATE OF CONSULTATION:  07/04/2011  REFERRING PHYSICIAN:  Dr. Judithann SheenSparks  CONSULTING PHYSICIAN:  Rose PhiPeter R. Kemper Durielarke, MD  HISTORY: Mr. 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 Mila Merryonald Fisher with history of hypertension, adult onset diabetes mellitus, hyperlipidemia, coronary artery disease and coronary artery bypass surgery, and history of atherosclerotic cerebrovascular disease status post 07/07/2009 left carotid endarterectomy surgery in the setting of admission 02/08 through 06/16/2009 for left hemisphere stroke with small areas of infarction in the middle cerebral artery territory of the posterior parietal lobe, and one of the frontoparietal periventricular white matter area. He is now admitted 07/02/2011 and is referred for evaluation of stroke with confusion. History comes from the patient, his hospital chart, his hospital records.   Patient was brought to the Emergency Room at 10:50 a.m. 07/02/2011 by EMTs summoned by patient's daughter with concern with regard to confusion reportedly noted first the prior evening. His initial vital signs included blood pressure 133/66 with heart rate 70 and respirations 20; oxygen saturation was 98%. On brain MRI scan, there is seen a small to moderate area of altered signal intensity on diffusion weighted imaging of the left inferior anterior frontal lobe region, consistent with nonhemorrhagic recent stroke. Doppler study of the carotid and vertebral arteries shows no evidence of surgical disease. On telemetry, he was noted to have bradycardia with rate 39 beats per minute at 9:50 a.m. on 07/03/2011. Between 2:50 and 3:05 p.m., he had bradycardia with heart rate in the 40s, lowest at 42. On Coreg 6.5 mg twice a day on admission, this was discontinued the evening of 07/03/2011. The patient is not aware of his problems, does not know why his daughter  called EMTs to bring him to the hospital.    PHYSICAL EXAMINATION: Patient is a well-developed, well-nourished elderly white gentleman who is examined lying semisupine, in no apparent distress normocephalic without evidence of trauma. His neck was supple. He was alert with clear speech. He was notable for mild to moderate problems with expression. He was able to name objects and repeat words and phrases, but would often search for words and answering questions when asked for details of surgeries that he has had in the past. He had no receptive problem. He was lucid and clearly overall a good overall fairly good historian. Cranial nerve examination showed symmetric facial appearance at rest and with directed movements, normal eye movements, visual fields full to finger count for each eye. Motor examination of the extremities showed normal tone and bulk throughout and normal strength throughout. Extremity coordination was symmetric and normal. His gait was not tested. Reflexes were trace throughout.   IMPRESSION: Mild to moderate expressive aphasia associated with MRI evidence of recent nonhemorrhagic stroke of the left frontal lobe in a patient with history of small areas of left middle cerebral artery territory infarction February 2001, and now in hospital have some bradycardia.   RECOMMENDATIONS:  1. I agree with his present work-up and treatment in hospital including imaging and laboratory studies.  2. I agree with continuing him on Plavix. He is also on simvastatin, on ACE inhibitor, and has now been started on fish oil.  3. I agree with indication for discontinuation of Coreg in the setting of finding of bradycardia.   I appreciate being asked to see this pleasant and interesting gentleman.   ____________________________ Rose PhiPeter R. Kemper Durielarke, MD prc:cms D: 07/04/2011 10:05:24 ET T:  07/04/2011 10:47:07 ET JOB#: [id number]  cc: Rose Phi. Kemper Durie, MD, <Dictator> Gaspar Garbe MD ELECTRONICALLY SIGNED  07/06/2011 18:00
